# Patient Record
Sex: Female | Born: 1985
Health system: Southern US, Community
[De-identification: ages and names within clinical notes are randomized; demographics above are authoritative.]

## PROBLEM LIST (undated history)

## (undated) DIAGNOSIS — E282 Polycystic ovarian syndrome: Secondary | ICD-10-CM

## (undated) HISTORY — DX: Polycystic ovarian syndrome: E28.2

## (undated) HISTORY — PX: NO PAST SURGERIES: SHX2092

---

## 2011-01-26 ENCOUNTER — Emergency Department (HOSPITAL_BASED_OUTPATIENT_CLINIC_OR_DEPARTMENT_OTHER)
Admission: EM | Admit: 2011-01-26 | Discharge: 2011-01-26 | Disposition: A | Discharge status: Home / Self Care | Payer: Self-pay | Attending: Emergency Medicine | Admitting: Emergency Medicine

## 2011-01-26 ENCOUNTER — Other Ambulatory Visit: Payer: Self-pay

## 2011-01-26 ENCOUNTER — Encounter: Payer: Self-pay | Admitting: *Deleted

## 2011-01-26 ENCOUNTER — Emergency Department (INDEPENDENT_AMBULATORY_CARE_PROVIDER_SITE_OTHER): Payer: Self-pay

## 2011-01-26 DIAGNOSIS — N390 Urinary tract infection, site not specified: Secondary | ICD-10-CM

## 2011-01-26 DIAGNOSIS — R109 Unspecified abdominal pain: Secondary | ICD-10-CM

## 2011-01-26 DIAGNOSIS — A5901 Trichomonal vulvovaginitis: Secondary | ICD-10-CM

## 2011-01-26 DIAGNOSIS — R079 Chest pain, unspecified: Secondary | ICD-10-CM | POA: Insufficient documentation

## 2011-01-26 LAB — URINALYSIS, ROUTINE W REFLEX MICROSCOPIC
Hgb urine dipstick: NEGATIVE
Nitrite: NEGATIVE
Protein, ur: NEGATIVE mg/dL
Specific Gravity, Urine: 1.02 (ref 1.005–1.030)
Urobilinogen, UA: 0.2 mg/dL (ref 0.0–1.0)

## 2011-01-26 LAB — URINE MICROSCOPIC-ADD ON

## 2011-01-26 LAB — BASIC METABOLIC PANEL
Calcium: 9.7 mg/dL (ref 8.4–10.5)
GFR calc Af Amer: >60 mL/min (ref >60)
GFR calc non Af Amer: >60 mL/min (ref >60)
Glucose, Bld: 97 mg/dL (ref 70–99)
Potassium: 3.3 mEq/L — ABNORMAL LOW (ref 3.5–5.1)
Sodium: 139 mEq/L (ref 135–145)

## 2011-01-26 LAB — PREGNANCY, URINE: Preg Test, Ur: NEGATIVE

## 2011-01-26 MED ORDER — NITROFURANTOIN MONOHYD MACRO 100 MG PO CAPS: 100.0000 mg | ORAL_CAPSULE | Freq: Two times a day (BID) | ORAL | Status: AC

## 2011-01-26 MED ORDER — METRONIDAZOLE 500 MG PO TABS
2000.0000 mg | ORAL_TABLET | Freq: Once | ORAL | Status: AC
  Administered 2011-01-26: 2000 mg via ORAL
  Filled 2011-01-26: qty 4

## 2011-01-26 NOTE — ED Provider Notes (Signed)
History     Chief Complaint  Patient presents with  . Abdominal Pain  . Chest Pain   Patient is a 25 y.o. female presenting with abdominal pain and chest pain. The history is provided by the patient. No language interpreter was used.  Abdominal Pain The primary symptoms of the illness include abdominal pain. The primary symptoms of the illness do not include fever, shortness of breath, nausea, vomiting, dysuria, vaginal discharge or vaginal bleeding. The current episode started yesterday. The onset of the illness was gradual. The problem has not changed since onset. The patient states that she believes she is currently not pregnant. The patient has not had a change in bowel habit. Symptoms associated with the illness do not include chills.  Chest Pain Primary symptoms include abdominal pain. Pertinent negatives for primary symptoms include no fever, no shortness of breath, no nausea and no vomiting.     History reviewed. No pertinent past medical history.  History reviewed. No pertinent past surgical history.  History reviewed. No pertinent family history.  History  Substance Use Topics  . Smoking status: Never Smoker   . Smokeless tobacco: Not on file  . Alcohol Use: No    OB History    Grav Para Term Preterm Abortions TAB SAB Ect Mult Living                  Review of Systems  Constitutional: Negative for fever and chills.  Respiratory: Negative for shortness of breath.   Cardiovascular: Positive for chest pain.  Gastrointestinal: Positive for abdominal pain. Negative for nausea and vomiting.  Genitourinary: Negative for dysuria, vaginal bleeding and vaginal discharge.  All other systems reviewed and are negative.    Physical Exam  BP 115/82  Pulse 76  Temp(Src) 97.9 F (36.6 C) (Oral)  Resp 16  Wt 168 lb (76.204 kg)  LMP 01/12/2011  Physical Exam  Nursing note and vitals reviewed. Constitutional: She is oriented to person, place, and time. She appears  well-developed and well-nourished.  HENT:  Head: Normocephalic and atraumatic.  Eyes: Pupils are equal, round, and reactive to light.  Cardiovascular: Normal rate and regular rhythm.   Pulmonary/Chest: Effort normal and breath sounds normal.  Abdominal: Soft. There is no tenderness.  Musculoskeletal: Normal range of motion.  Neurological: She is alert and oriented to person, place, and time.  Skin: Skin is warm and dry.  Psychiatric: She has a normal mood and affect.    ED Course  Procedures  Results for orders placed during the hospital encounter of 01/26/11  PREGNANCY, URINE      Component Value Range   Preg Test, Ur NEGATIVE    URINALYSIS, ROUTINE W REFLEX MICROSCOPIC      Component Value Range   Color, Urine YELLOW  YELLOW    Appearance CLEAR  CLEAR    Specific Gravity, Urine 1.020  1.005 - 1.030    pH 7.0  5.0 - 8.0    Glucose, UA NEGATIVE  NEGATIVE (mg/dL)   Hgb urine dipstick NEGATIVE  NEGATIVE    Bilirubin Urine NEGATIVE  NEGATIVE    Ketones, ur NEGATIVE  NEGATIVE (mg/dL)   Protein, ur NEGATIVE  NEGATIVE (mg/dL)   Urobilinogen, UA 0.2  0.0 - 1.0 (mg/dL)   Nitrite NEGATIVE  NEGATIVE    Leukocytes, UA SMALL (*) NEGATIVE   BASIC METABOLIC PANEL      Component Value Range   Sodium 139  135 - 145 (mEq/L)   Potassium 3.3 (*) 3.5 -  5.1 (mEq/L)   Chloride 98  96 - 112 (mEq/L)   CO2 27  19 - 32 (mEq/L)   Glucose, Bld 97  70 - 99 (mg/dL)   BUN 8  6 - 23 (mg/dL)   Creatinine, Ser 1.61  0.50 - 1.10 (mg/dL)   Calcium 9.7  8.4 - 09.6 (mg/dL)   GFR calc non Af Amer >60  >60 (mL/min)   GFR calc Af Amer >60  >60 (mL/min)  URINE MICROSCOPIC-ADD ON      Component Value Range   Squamous Epithelial / LPF FEW (*) RARE    WBC, UA 7-10  <3 (WBC/hpf)   RBC / HPF 0-2  <3 (RBC/hpf)   Bacteria, UA FEW (*) RARE    Urine-Other TRICHOMONAS PRESENT     Dg Abd Acute W/chest  01/26/2011  *RADIOLOGY REPORT*  Clinical Data: Abdominal pain  ACUTE ABDOMEN SERIES (ABDOMEN 2 VIEW & CHEST 1  VIEW)  Comparison: None.  Findings: Frontal chest radiograph clear.  No free air on the erect film.  Normal bowel gas pattern.  Bilateral pelvic phleboliths. Regional bones unremarkable.  IMPRESSION: 1. Normal bowel gas pattern. 2.  No free air. 3.  No acute cardiopulmonary disease.  Original Report Authenticated By: Osa Craver, M.D.   Date: 01/26/2011  Rate: 75  Rhythm: normal sinus rhythm  QRS Axis: normal  Intervals: normal  ST/T Wave abnormalities: normal  Conduction Disutrbances:none  Narrative Interpretation:   Old EKG Reviewed: none available   MDM Discussed with pt that she needs to follow up at the std clinic for further evaluation:pt has a benign abdominal exam:will treat for simple uti:pt is afebrile       Teressa Lower, NP 01/26/11 2215

## 2011-01-26 NOTE — ED Notes (Signed)
Pt c/o chest pain , abd pain x 2 days. Denies n/v/d

## 2011-01-31 NOTE — ED Provider Notes (Signed)
Medical screening examination/treatment/procedure(s) were performed by non-physician practitioner and as supervising physician I was immediately available for consultation/collaboration.  Geoffery Lyons, MD 01/31/11 [phone]

## 2012-06-12 ENCOUNTER — Emergency Department (HOSPITAL_BASED_OUTPATIENT_CLINIC_OR_DEPARTMENT_OTHER)
Admission: EM | Admit: 2012-06-12 | Discharge: 2012-06-12 | Disposition: A | Discharge status: Home / Self Care | Payer: Self-pay | Attending: Emergency Medicine | Admitting: Emergency Medicine

## 2012-06-12 ENCOUNTER — Encounter (HOSPITAL_BASED_OUTPATIENT_CLINIC_OR_DEPARTMENT_OTHER): Payer: Self-pay | Admitting: *Deleted

## 2012-06-12 DIAGNOSIS — K5900 Constipation, unspecified: Secondary | ICD-10-CM | POA: Insufficient documentation

## 2012-06-12 DIAGNOSIS — N760 Acute vaginitis: Secondary | ICD-10-CM | POA: Insufficient documentation

## 2012-06-12 DIAGNOSIS — Z3202 Encounter for pregnancy test, result negative: Secondary | ICD-10-CM | POA: Insufficient documentation

## 2012-06-12 LAB — URINALYSIS, ROUTINE W REFLEX MICROSCOPIC
Glucose, UA: NEGATIVE mg/dL
Hgb urine dipstick: NEGATIVE
Ketones, ur: NEGATIVE mg/dL
pH: 5.5 (ref 5.0–8.0)

## 2012-06-12 LAB — WET PREP, GENITAL: Yeast Wet Prep HPF POC: NONE SEEN

## 2012-06-12 LAB — URINE MICROSCOPIC-ADD ON

## 2012-06-12 LAB — PREGNANCY, URINE: Preg Test, Ur: NEGATIVE

## 2012-06-12 MED ORDER — METRONIDAZOLE 500 MG PO TABS: 500.0000 mg | ORAL_TABLET | Freq: Two times a day (BID) | ORAL | Status: DC

## 2012-06-12 NOTE — ED Notes (Signed)
MD at bedside. 

## 2012-06-12 NOTE — ED Provider Notes (Signed)
History     CSN: [id number]  Arrival date & time 06/12/12  4098   First MD Initiated Contact with Patient 06/12/12 1801      Chief Complaint  Patient presents with  . Abdominal Pain    (Consider location/radiation/quality/duration/timing/severity/associated sxs/prior treatment) HPI Pt reports 3 months of abdominal bloating, breast soreness, constipation worsening in the last few weeks and associated with vaginal discharge for the last 3 weeks. She has history of trichomonas last year. Does not think she is pregnant. Pain is moderate cramping, mostly lower abdomen, comes and goes, No particular provoking or relieving factors.  History reviewed. No pertinent past medical history.  History reviewed. No pertinent past surgical history.  No family history on file.  History  Substance Use Topics  . Smoking status: Never Smoker   . Smokeless tobacco: Not on file  . Alcohol Use: No    OB History    Grav Para Term Preterm Abortions TAB SAB Ect Mult Living                  Review of Systems All other systems reviewed and are negative except as noted in HPI.   Allergies  Review of patient's allergies indicates no known allergies.  Home Medications   Current Outpatient Rx  Name  Route  Sig  Dispense  Refill  . ACAI BERRY PO   Oral   Take 2 tablets by mouth 2 (two) times daily.           Marland Kitchen ONE-DAILY MULTI VITAMINS PO TABS   Oral   Take 1 tablet by mouth daily.             BP 106/70  Pulse 88  Temp 98.3 F (36.8 C) (Oral)  Resp 18  SpO2 100%  LMP 05/18/2012  Physical Exam  Nursing note and vitals reviewed. Constitutional: She is oriented to person, place, and time. She appears well-developed and well-nourished.  HENT:  Head: Normocephalic and atraumatic.  Eyes: EOM are normal. Pupils are equal, round, and reactive to light.  Neck: Normal range of motion. Neck supple.  Cardiovascular: Normal rate, normal heart sounds and intact distal pulses.    Pulmonary/Chest: Effort normal and breath sounds normal. Right breast exhibits tenderness. Right breast exhibits no mass, no nipple discharge and no skin change. Left breast exhibits tenderness. Left breast exhibits no mass, no nipple discharge and no skin change. Breasts are symmetrical.  Abdominal: Bowel sounds are normal. She exhibits no distension. There is no tenderness. There is no rebound and no guarding.  Genitourinary: Uterus is not tender. Cervix exhibits discharge and friability. Cervix exhibits no motion tenderness. Right adnexum displays no mass and no tenderness. Left adnexum displays no tenderness. No bleeding around the vagina. Vaginal discharge found.  Musculoskeletal: Normal range of motion. She exhibits no edema and no tenderness.  Neurological: She is alert and oriented to person, place, and time. She has normal strength. No cranial nerve deficit or sensory deficit.  Skin: Skin is warm and dry. No rash noted.  Psychiatric: She has a normal mood and affect.    ED Course  Procedures (including critical care time)  Labs Reviewed  URINALYSIS, ROUTINE W REFLEX MICROSCOPIC - Abnormal; Notable for the following:    Leukocytes, UA SMALL (*)     All other components within normal limits  URINE MICROSCOPIC-ADD ON - Abnormal; Notable for the following:    Squamous Epithelial / LPF MANY (*)     Bacteria, UA MANY (*)  All other components within normal limits  PREGNANCY, URINE  URINE CULTURE  WET PREP, GENITAL  GC/CHLAMYDIA PROBE AMP   No results found.   No diagnosis found.    MDM  Wet prep positive for BV, will treat with Flagyl. I suspect that her abdominal bloating is more due to constipation than to a pelvic infection. Advised stool softener. PCP follow up as needed.         Quenton Recendez B. Bernette Mayers, MD 06/12/12 1910

## 2012-06-12 NOTE — ED Notes (Signed)
Abdominal tightness, breast soreness and vaginal discharge x 3 months. LMP November 15th.

## 2012-06-13 LAB — URINE CULTURE

## 2012-11-10 ENCOUNTER — Encounter (HOSPITAL_BASED_OUTPATIENT_CLINIC_OR_DEPARTMENT_OTHER): Payer: Self-pay | Admitting: *Deleted

## 2012-11-10 ENCOUNTER — Emergency Department (HOSPITAL_BASED_OUTPATIENT_CLINIC_OR_DEPARTMENT_OTHER)
Admission: EM | Admit: 2012-11-10 | Discharge: 2012-11-11 | Disposition: A | Discharge status: Home / Self Care | Payer: Self-pay | Attending: Emergency Medicine | Admitting: Emergency Medicine

## 2012-11-10 DIAGNOSIS — R109 Unspecified abdominal pain: Secondary | ICD-10-CM | POA: Insufficient documentation

## 2012-11-10 DIAGNOSIS — Z79899 Other long term (current) drug therapy: Secondary | ICD-10-CM | POA: Insufficient documentation

## 2012-11-10 DIAGNOSIS — K5900 Constipation, unspecified: Secondary | ICD-10-CM | POA: Insufficient documentation

## 2012-11-10 DIAGNOSIS — Z3202 Encounter for pregnancy test, result negative: Secondary | ICD-10-CM | POA: Insufficient documentation

## 2012-11-10 LAB — URINALYSIS, ROUTINE W REFLEX MICROSCOPIC
Bilirubin Urine: NEGATIVE
Glucose, UA: NEGATIVE mg/dL
Hgb urine dipstick: NEGATIVE
Ketones, ur: NEGATIVE mg/dL
Protein, ur: NEGATIVE mg/dL

## 2012-11-10 NOTE — ED Provider Notes (Signed)
History    This chart was scribed for Hanley Seamen, MD by Leone Payor, ED Scribe. This patient was seen in room MH07/MH07 and the patient's care was started 12:02 AM.   CSN: [id number]  Arrival date & time 11/10/12  2309   None     Chief Complaint  Patient presents with  . Abdominal Pain     The history is provided by the patient. No language interpreter was used.    HPI Comments: Julie Reyes is a 27 y.o. female who presents to the Emergency Department complaining of ongoing, episodic R upper abdominal pain, described as a tightness, starting 3 weeks ago. She also has LLQ and RLQ ("in my ovaries") pain that started 1 week ago. She has associated constipation for which she has taken a laxative and stool softeners for 2 days with moderate relief. Pain sometimes worsens after eating. She denies vaginal discharge or bleeding, nausea, vomiting, diarrhea, trouble urinating, dysuria, hematuria. Pt denies smoking and alcohol use.    History reviewed. No pertinent past medical history.  History reviewed. No pertinent past surgical history.  History reviewed. No pertinent family history.  History  Substance Use Topics  . Smoking status: Never Smoker   . Smokeless tobacco: Not on file  . Alcohol Use: No    OB History   Grav Para Term Preterm Abortions TAB SAB Ect Mult Living                  Review of Systems A complete 10 system review of systems was obtained and all systems are negative except as noted in the HPI and PMH.   Allergies  Review of patient's allergies indicates no known allergies.  Home Medications   Current Outpatient Rx  Name  Route  Sig  Dispense  Refill  . ACAI BERRY PO   Oral   Take 2 tablets by mouth 2 (two) times daily.           . metroNIDAZOLE (FLAGYL) 500 MG tablet   Oral   Take 1 tablet (500 mg total) by mouth 2 (two) times daily.   14 tablet   0   . Multiple Vitamin (MULTIVITAMIN) tablet   Oral   Take 1 tablet by mouth daily.              BP 118/75  Pulse 79  Temp(Src) 98.5 F (36.9 C) (Oral)  Resp 16  Ht 5\' 1"  (1.549 m)  Wt 155 lb (70.308 kg)  BMI 29.3 kg/m2  SpO2 100%  LMP 10/17/2012  Physical Exam  Nursing note and vitals reviewed. Constitutional: She appears well-developed and well-nourished.  HENT:  Head: Normocephalic and atraumatic.  Eyes: Conjunctivae are normal. Pupils are equal, round, and reactive to light.  Neck: Neck supple. No tracheal deviation present. No thyromegaly present.  Cardiovascular: Normal rate and regular rhythm.   No murmur heard. Pulmonary/Chest: Effort normal and breath sounds normal.  Abdominal: Soft. Bowel sounds are normal. She exhibits no distension.  RUQ tenderness. Minimal RLQ tenderness.   Musculoskeletal: Normal range of motion. She exhibits no edema and no tenderness.  Neurological: She is alert. Coordination normal.  Skin: Skin is warm and dry. No rash noted.  Psychiatric: She has a normal mood and affect.  Abdominal: no gallstones seen on bedside ultrasound  ED Course  Procedures (including critical care time)  DIAGNOSTIC STUDIES: Oxygen Saturation is 100% on room air, normal by my interpretation.    COORDINATION OF CARE: 12:14 AM Discussed treatment  plan with pt at bedside and pt agreed to plan.     MDM   Nursing notes and vitals signs, including pulse oximetry, reviewed.  Summary of this visit's results, reviewed by myself:  Labs:  Results for orders placed during the hospital encounter of 11/10/12 (from the past 24 hour(s))  URINALYSIS, ROUTINE W REFLEX MICROSCOPIC     Status: Abnormal   Collection Time    11/10/12 11:26 PM      Result Value Range   Color, Urine YELLOW  YELLOW   APPearance CLEAR  CLEAR   Specific Gravity, Urine 1.031 (*) 1.005 - 1.030   pH 7.0  5.0 - 8.0   Glucose, UA NEGATIVE  NEGATIVE mg/dL   Hgb urine dipstick NEGATIVE  NEGATIVE   Bilirubin Urine NEGATIVE  NEGATIVE   Ketones, ur NEGATIVE  NEGATIVE mg/dL   Protein, ur  NEGATIVE  NEGATIVE mg/dL   Urobilinogen, UA 1.0  0.0 - 1.0 mg/dL   Nitrite NEGATIVE  NEGATIVE   Leukocytes, UA NEGATIVE  NEGATIVE  PREGNANCY, URINE     Status: None   Collection Time    11/10/12 11:26 PM      Result Value Range   Preg Test, Ur NEGATIVE  NEGATIVE  CBC WITH DIFFERENTIAL     Status: Abnormal   Collection Time    11/11/12 12:44 AM      Result Value Range   WBC 8.3  4.0 - 10.5 K/uL   RBC 4.23  3.87 - 5.11 MIL/uL   Hemoglobin 12.0  12.0 - 15.0 g/dL   HCT 21.3 (*) 08.6 - 57.8 %   MCV 82.5  78.0 - 100.0 fL   MCH 28.4  26.0 - 34.0 pg   MCHC 34.4  30.0 - 36.0 g/dL   RDW 46.9  62.9 - 52.8 %   Platelets 241  150 - 400 K/uL   Neutrophils Relative 46  43 - 77 %   Neutro Abs 3.8  1.7 - 7.7 K/uL   Lymphocytes Relative 43  12 - 46 %   Lymphs Abs 3.5  0.7 - 4.0 K/uL   Monocytes Relative 9  3 - 12 %   Monocytes Absolute 0.8  0.1 - 1.0 K/uL   Eosinophils Relative 2  0 - 5 %   Eosinophils Absolute 0.1  0.0 - 0.7 K/uL   Basophils Relative 0  0 - 1 %   Basophils Absolute 0.0  0.0 - 0.1 K/uL  LIPASE, BLOOD     Status: None   Collection Time    11/11/12 12:44 AM      Result Value Range   Lipase 27  11 - 59 U/L  COMPREHENSIVE METABOLIC PANEL     Status: Abnormal   Collection Time    11/11/12 12:44 AM      Result Value Range   Sodium 139  135 - 145 mEq/L   Potassium 3.7  3.5 - 5.1 mEq/L   Chloride 104  96 - 112 mEq/L   CO2 24  19 - 32 mEq/L   Glucose, Bld 99  70 - 99 mg/dL   BUN 14  6 - 23 mg/dL   Creatinine, Ser 4.13  0.50 - 1.10 mg/dL   Calcium 9.1  8.4 - 24.4 mg/dL   Total Protein 6.6  6.0 - 8.3 g/dL   Albumin 3.5  3.5 - 5.2 g/dL   AST 12  0 - 37 U/L   ALT 6  0 - 35 U/L   Alkaline Phosphatase  55  39 - 117 U/L   Total Bilirubin 0.2 (*) 0.3 - 1.2 mg/dL   GFR calc non Af Amer >90  >90 mL/min   GFR calc Af Amer >90  >90 mL/min   1:26 AM Patient's pain and tenderness are minimal in the ED. We will obtain an outpatient ultrasound of the gallbladder. She was advised to  take an over-the-counter PPI such as Prilosec or Prevacid.   The patient states the lower "ovarian" pain has been coming and going for an extended period of time. Her acute complaint is the right-sided upper abdominal pain.    I personally performed the services described in this documentation, which was scribed in my presence.  The recorded information has been reviewed and is accurate.  Hanley Seamen, MD 11/11/12 [phone]

## 2012-11-10 NOTE — ED Notes (Signed)
Pt states she has had a "tightness" in her abd on the right side "in the middle" x 2-3 weeks. "My ovaries hurt. It started on the right and then both side began to hurt." Also c/o constipation. Last BM today, but "had to push". Denies vaginal d/c or urinary s/s.

## 2012-11-11 ENCOUNTER — Ambulatory Visit (HOSPITAL_BASED_OUTPATIENT_CLINIC_OR_DEPARTMENT_OTHER)
Admit: 2012-11-11 | Discharge: 2012-11-11 | Disposition: A | Discharge status: Home / Self Care | Payer: Self-pay | Attending: Emergency Medicine | Admitting: Emergency Medicine

## 2012-11-11 DIAGNOSIS — R1011 Right upper quadrant pain: Secondary | ICD-10-CM | POA: Insufficient documentation

## 2012-11-11 LAB — CBC WITH DIFFERENTIAL/PLATELET
Basophils Relative: 0 % (ref 0–1)
Eosinophils Absolute: 0.1 10*3/uL (ref 0.0–0.7)
HCT: 34.9 % — ABNORMAL LOW (ref 36.0–46.0)
Hemoglobin: 12 g/dL (ref 12.0–15.0)
MCH: 28.4 pg (ref 26.0–34.0)
MCHC: 34.4 g/dL (ref 30.0–36.0)
MCV: 82.5 fL (ref 78.0–100.0)
Monocytes Absolute: 0.8 10*3/uL (ref 0.1–1.0)
Monocytes Relative: 9 % (ref 3–12)
Neutro Abs: 3.8 10*3/uL (ref 1.7–7.7)

## 2012-11-11 LAB — COMPREHENSIVE METABOLIC PANEL
Alkaline Phosphatase: 55 U/L (ref 39–117)
BUN: 14 mg/dL (ref 6–23)
GFR calc Af Amer: >90 mL/min (ref >90)
Glucose, Bld: 99 mg/dL (ref 70–99)
Potassium: 3.7 mEq/L (ref 3.5–5.1)
Total Protein: 6.6 g/dL (ref 6.0–8.3)

## 2012-11-11 MED ORDER — PANTOPRAZOLE SODIUM 40 MG IV SOLR: 40.0000 mg | Freq: Once | INTRAVENOUS | Status: DC

## 2012-11-11 MED ORDER — PANTOPRAZOLE SODIUM 40 MG PO TBEC
40.0000 mg | DELAYED_RELEASE_TABLET | Freq: Once | ORAL | Status: AC
  Administered 2012-11-11: 40 mg via ORAL

## 2012-11-11 MED ORDER — PANTOPRAZOLE SODIUM 40 MG PO TBEC
DELAYED_RELEASE_TABLET | ORAL | Status: AC
  Administered 2012-11-11: 40 mg via ORAL
  Filled 2012-11-11: qty 1

## 2012-11-11 NOTE — ED Notes (Signed)
MD at bedside with ultrasound to assess

## 2012-11-11 NOTE — ED Notes (Signed)
Pt verbalizes understanding to f/u with ultrasound

## 2013-08-05 ENCOUNTER — Other Ambulatory Visit (HOSPITAL_COMMUNITY): Payer: Self-pay | Admitting: Obstetrics & Gynecology

## 2013-08-05 DIAGNOSIS — IMO0002 Reserved for concepts with insufficient information to code with codable children: Secondary | ICD-10-CM

## 2013-08-09 ENCOUNTER — Ambulatory Visit (HOSPITAL_COMMUNITY)
Admission: RE | Admit: 2013-08-09 | Discharge: 2013-08-09 | Disposition: A | Discharge status: Home / Self Care | Payer: 59 | Source: Ambulatory Visit | Attending: Obstetrics & Gynecology | Admitting: Obstetrics & Gynecology

## 2013-08-09 ENCOUNTER — Ambulatory Visit (HOSPITAL_COMMUNITY): Admission: RE | Admit: 2013-08-09 | Payer: 59 | Source: Ambulatory Visit

## 2013-08-09 DIAGNOSIS — N979 Female infertility, unspecified: Secondary | ICD-10-CM | POA: Insufficient documentation

## 2013-08-09 DIAGNOSIS — IMO0002 Reserved for concepts with insufficient information to code with codable children: Secondary | ICD-10-CM

## 2013-08-09 MED ORDER — IOHEXOL 300 MG/ML  SOLN
20.0000 mL | Freq: Once | INTRAMUSCULAR | Status: AC | PRN
  Administered 2013-08-09: 6 mL

## 2013-08-15 ENCOUNTER — Ambulatory Visit (HOSPITAL_COMMUNITY): Payer: 59

## 2013-09-19 ENCOUNTER — Ambulatory Visit (INDEPENDENT_AMBULATORY_CARE_PROVIDER_SITE_OTHER): Payer: 59 | Admitting: *Deleted

## 2013-09-19 DIAGNOSIS — N979 Female infertility, unspecified: Secondary | ICD-10-CM

## 2013-09-20 LAB — PROGESTERONE: Progesterone: 0.6 ng/mL

## 2013-10-02 ENCOUNTER — Other Ambulatory Visit: Payer: Self-pay | Admitting: Cardiology

## 2013-10-02 ENCOUNTER — Ambulatory Visit: Payer: 59

## 2013-10-02 DIAGNOSIS — N912 Amenorrhea, unspecified: Secondary | ICD-10-CM

## 2013-10-03 LAB — HCG, SERUM, QUALITATIVE: Preg, Serum: NEGATIVE

## 2013-12-30 ENCOUNTER — Encounter (INDEPENDENT_AMBULATORY_CARE_PROVIDER_SITE_OTHER): Payer: Self-pay

## 2013-12-30 ENCOUNTER — Ambulatory Visit (INDEPENDENT_AMBULATORY_CARE_PROVIDER_SITE_OTHER): Payer: 59 | Admitting: Pulmonary Disease

## 2013-12-30 ENCOUNTER — Encounter: Payer: Self-pay | Admitting: Pulmonary Disease

## 2013-12-30 VITALS — BP 118/80 | HR 80 | Temp 98.5°F | Ht 60.0 in | Wt 171.4 lb

## 2013-12-30 DIAGNOSIS — G4733 Obstructive sleep apnea (adult) (pediatric): Secondary | ICD-10-CM | POA: Insufficient documentation

## 2013-12-30 NOTE — Progress Notes (Signed)
Subjective:    Patient ID: Julie CreaseShanice Modesto, female    DOB: 12/16/1985, 28 y.o.   MRN: [id number]  HPI The patient is a 28 year old female who I've been asked to see for possible obstructive sleep apnea. She has been noted to have loud snoring, as well as an abnormal breathing pattern during sleep by her husband. She awakens approximately twice a night, but this is much improved since she has lost a little bit of weight. She typically will rise 5 AM to take her husband to work, and then will come back and take a one-hour nap before she goes to work. She is not rested most mornings. She notes definite sleep pressure during the day with inactivity, and can doze at home in the evenings while watching television or movies. She also notes some sleepiness driving longer distances. The patient has gained 20 pounds over the last 2 years, and her Epworth score today is 8.   Sleep Questionnaire What time do you typically go to bed?( Between what hours) 9-11pm 9-11pm at 1131 on 12/30/13 by Darrell JewelJennifer R Castillo, CMA How long does it take you to fall asleep? 10-15 minutes 10-15 minutes at 1131 on 12/30/13 by Darrell JewelJennifer R Castillo, CMA How many times during the night do you wake up? 2 2 at 1131 on 12/30/13 by Darrell JewelJennifer R Castillo, CMA What time do you get out of bed to start your day? 0500 0500 at 1131 on 12/30/13 by Darrell JewelJennifer R Castillo, CMA Do you drive or operate heavy machinery in your occupation? No No at 1131 on 12/30/13 by Darrell JewelJennifer R Castillo, CMA How much has your weight changed (up or down) over the past two years? (In pounds) 20 lb (9.072 kg) 20 lb (9.072 kg) at 1131 on 12/30/13 by Darrell JewelJennifer R Castillo, CMA Have you ever had a sleep study before? No No at 1131 on 12/30/13 by Darrell JewelJennifer R Castillo, CMA Do you currently use CPAP? No No at 1131 on 12/30/13 by Darrell JewelJennifer R Castillo, CMA Do you wear oxygen at any time? No No at 1131 on 12/30/13 by Darrell JewelJennifer R Castillo, CMA   Review of Systems    Constitutional: Negative for fever and unexpected weight change.  HENT: Negative for congestion, dental problem, ear pain, nosebleeds, postnasal drip, rhinorrhea, sinus pressure, sneezing, sore throat and trouble swallowing.   Eyes: Negative for redness and itching.  Respiratory: Positive for shortness of breath. Negative for cough, chest tightness and wheezing.   Cardiovascular: Positive for chest pain. Negative for palpitations and leg swelling.  Gastrointestinal: Negative for nausea and vomiting.  Genitourinary: Negative for dysuria.  Musculoskeletal: Negative for joint swelling.  Skin: Negative for rash.  Neurological: Negative for headaches.  Hematological: Does not bruise/bleed easily.  Psychiatric/Behavioral: Negative for dysphoric mood. The patient is not nervous/anxious.        Objective:   Physical Exam  Constitutional:  Well developed, no acute distress  HENT:  Nares patent without discharge, but large turbinates  Oropharynx without exudate, palate and uvula are mildly elongated, very large tonsils.  Eyes:  Perrla, eomi, no scleral icterus  Neck:  No JVD, no TMG  Cardiovascular:  Normal rate, regular rhythm, no rubs or gallops.  No murmurs        Intact distal pulses  Pulmonary :  Normal breath sounds, no stridor or respiratory distress   No rales, rhonchi, or wheezing  Abdominal:  Soft, nondistended, bowel sounds present.  No tenderness noted.   Musculoskeletal:  No lower extremity edema noted.  Lymph Nodes:  No cervical lymphadenopathy noted  Skin:  No cyanosis noted  Neurologic:  Alert, appropriate, moves all 4 extremities without obvious deficit.        Assessment & Plan:

## 2013-12-30 NOTE — Assessment & Plan Note (Signed)
The patient's history is very suggestive of some type of sleep disordered breathing. It is unclear whether she has full-blown sleep apnea, or the upper airway resistant syndrome. She does have weight to lose, but also has very large tonsils that are clearly decreasing her airway size. It had a long discussion with her about sleep apnea, including its impact to her quality of life and cardiovascular health. She will need a sleep study for diagnosis, and I think she is an excellent candidate for home sleep testing. The patient is agreeable to this approach.

## 2013-12-30 NOTE — Patient Instructions (Signed)
Will schedule for home sleep testing, and will arrange followup once the results are available.   

## 2014-01-06 ENCOUNTER — Telehealth: Payer: Self-pay | Admitting: Pulmonary Disease

## 2014-01-06 NOTE — Telephone Encounter (Signed)
Please advise PCC;s thanks 

## 2014-01-07 NOTE — Telephone Encounter (Signed)
Left pt a detailed message that HST has been approved and she will be hearing from pcc's within the next few week for the HST machine to take hime Julie Reyes

## 2014-01-28 ENCOUNTER — Telehealth: Payer: Self-pay | Admitting: Pulmonary Disease

## 2014-01-28 NOTE — Telephone Encounter (Signed)
Spoke to pt she is aware we will be calling her in about a week to set up HST Tobe SosSally E Reyes

## 2014-02-06 DIAGNOSIS — G4730 Sleep apnea, unspecified: Secondary | ICD-10-CM

## 2014-02-06 DIAGNOSIS — G4710 Hypersomnia, unspecified: Secondary | ICD-10-CM

## 2014-02-11 ENCOUNTER — Telehealth: Payer: Self-pay | Admitting: Pulmonary Disease

## 2014-02-11 DIAGNOSIS — G4710 Hypersomnia, unspecified: Secondary | ICD-10-CM

## 2014-02-11 DIAGNOSIS — G4730 Sleep apnea, unspecified: Secondary | ICD-10-CM

## 2014-02-11 NOTE — Telephone Encounter (Signed)
Appt set to review sleep study on 03-14-14. Carron CurieJennifer Creighton Longley, CMA

## 2014-02-11 NOTE — Telephone Encounter (Signed)
Pt needs ov to review sleep study 

## 2014-02-12 ENCOUNTER — Encounter: Payer: Self-pay | Admitting: Pulmonary Disease

## 2014-03-14 ENCOUNTER — Ambulatory Visit (INDEPENDENT_AMBULATORY_CARE_PROVIDER_SITE_OTHER): Payer: 59 | Admitting: Pulmonary Disease

## 2014-03-14 ENCOUNTER — Encounter: Payer: Self-pay | Admitting: Pulmonary Disease

## 2014-03-14 VITALS — BP 118/66 | HR 76 | Temp 98.4°F | Ht 60.0 in | Wt 172.2 lb

## 2014-03-14 DIAGNOSIS — G4733 Obstructive sleep apnea (adult) (pediatric): Secondary | ICD-10-CM

## 2014-03-14 NOTE — Progress Notes (Signed)
   Subjective:    Patient ID: Julie Reyes, female    DOB: 02/11/1986, 28 y.o.   MRN: [id number]  HPI Patient comes in today for followup of her recent sleep study. She was found to have minimal obstructive sleep apnea, with an AHI of 5 events per hour. I have reviewed the study with her in detail, and answered all of her questions.   Review of Systems  Constitutional: Negative for fever and unexpected weight change.  HENT: Negative for congestion, dental problem, ear pain, nosebleeds, postnasal drip, rhinorrhea, sinus pressure, sneezing, sore throat and trouble swallowing.   Eyes: Negative for redness and itching.  Respiratory: Negative for cough, chest tightness, shortness of breath and wheezing.   Cardiovascular: Negative for palpitations and leg swelling.  Gastrointestinal: Negative for nausea and vomiting.  Genitourinary: Negative for dysuria.  Musculoskeletal: Negative for joint swelling.  Skin: Negative for rash.  Neurological: Negative for headaches.  Hematological: Does not bruise/bleed easily.  Psychiatric/Behavioral: Negative for dysphoric mood. The patient is not nervous/anxious.        Objective:   Physical Exam Overweight female in no acute distress Nose without purulence or discharge noted Neck without lymphadenopathy or thyromegaly Lower extremities without edema, no cyanosis Alert and oriented, moves all 4 extremities.       Assessment & Plan:

## 2014-03-14 NOTE — Assessment & Plan Note (Signed)
The patient has minimal obstructive sleep apnea by her recent home sleep test, and I have reviewed the results with her. I have outlined a conservative approach with a trial of weight loss and positional therapy, versus a more aggressive approach with CPAP or a dental appliance. After a long conversation, the patient would like to try weight loss alone for a period of time. If she is unsuccessful, or if her symptoms worsen, she will call us to consider more aggressive treatment.

## 2014-03-14 NOTE — Patient Instructions (Signed)
Work on weight loss, and try to stay off your back while sleeping. Please call if you are not successful with weight loss, or if you would like to treat your sleep apnea more aggressively as we discussed.

## 2014-04-04 ENCOUNTER — Emergency Department (HOSPITAL_BASED_OUTPATIENT_CLINIC_OR_DEPARTMENT_OTHER)
Admission: EM | Admit: 2014-04-04 | Discharge: 2014-04-04 | Disposition: A | Discharge status: Home / Self Care | Payer: 59 | Attending: Emergency Medicine | Admitting: Emergency Medicine

## 2014-04-04 ENCOUNTER — Encounter (HOSPITAL_BASED_OUTPATIENT_CLINIC_OR_DEPARTMENT_OTHER): Payer: Self-pay | Admitting: Emergency Medicine

## 2014-04-04 ENCOUNTER — Emergency Department (HOSPITAL_BASED_OUTPATIENT_CLINIC_OR_DEPARTMENT_OTHER): Payer: 59

## 2014-04-04 DIAGNOSIS — S199XXA Unspecified injury of neck, initial encounter: Secondary | ICD-10-CM | POA: Diagnosis present

## 2014-04-04 DIAGNOSIS — S134XXA Sprain of ligaments of cervical spine, initial encounter: Secondary | ICD-10-CM | POA: Diagnosis not present

## 2014-04-04 DIAGNOSIS — E282 Polycystic ovarian syndrome: Secondary | ICD-10-CM | POA: Diagnosis not present

## 2014-04-04 DIAGNOSIS — Y92410 Unspecified street and highway as the place of occurrence of the external cause: Secondary | ICD-10-CM | POA: Diagnosis not present

## 2014-04-04 DIAGNOSIS — Y9389 Activity, other specified: Secondary | ICD-10-CM | POA: Diagnosis not present

## 2014-04-04 DIAGNOSIS — Z79899 Other long term (current) drug therapy: Secondary | ICD-10-CM | POA: Insufficient documentation

## 2014-04-04 DIAGNOSIS — S161XXA Strain of muscle, fascia and tendon at neck level, initial encounter: Secondary | ICD-10-CM

## 2014-04-04 MED ORDER — HYDROCODONE-ACETAMINOPHEN 5-325 MG PO TABS: 1.0000 | ORAL_TABLET | ORAL | Status: DC | PRN

## 2014-04-04 MED ORDER — CYCLOBENZAPRINE HCL 10 MG PO TABS: 10.0000 mg | ORAL_TABLET | Freq: Two times a day (BID) | ORAL | Status: DC | PRN

## 2014-04-04 MED ORDER — IBUPROFEN 800 MG PO TABS: 800.0000 mg | ORAL_TABLET | Freq: Three times a day (TID) | ORAL | Status: DC

## 2014-04-04 MED ORDER — HYDROCODONE-ACETAMINOPHEN 5-325 MG PO TABS
1.0000 | ORAL_TABLET | Freq: Once | ORAL | Status: AC
  Administered 2014-04-04: 1 via ORAL
  Filled 2014-04-04: qty 1

## 2014-04-04 NOTE — ED Provider Notes (Signed)
Medical screening examination/treatment/procedure(s) were performed by non-physician practitioner and as supervising physician I was immediately available for consultation/collaboration.   EKG Interpretation None        Jenniffer Vessels, MD 04/04/14 2333 
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
## 2014-04-04 NOTE — Discharge Instructions (Signed)
Cervical Sprain A cervical sprain is when the tissues (ligaments) that hold the neck bones in place stretch or tear. HOME CARE   Put ice on the injured area.  Put ice in a plastic bag.  Place a towel between your skin and the bag.  Leave the ice on for 15-20 minutes, 3-4 times a day.  You may have been given a collar to wear. This collar keeps your neck from moving while you heal.  Do not take the collar off unless told by your doctor.  If you have long hair, keep it outside of the collar.  Ask your doctor before changing the position of your collar. You may need to change its position over time to make it more comfortable.  If you are allowed to take off the collar for cleaning or bathing, follow your doctor's instructions on how to do it safely.  Keep your collar clean by wiping it with mild soap and water. Dry it completely. If the collar has removable pads, remove them every 1-2 days to hand wash them with soap and water. Allow them to air dry. They should be dry before you wear them in the collar.  Do not drive while wearing the collar.  Only take medicine as told by your doctor.  Keep all doctor visits as told.  Keep all physical therapy visits as told.  Adjust your work station so that you have good posture while you work.  Avoid positions and activities that make your problems worse.  Warm up and stretch before being active. GET HELP IF:  Your pain is not controlled with medicine.  You cannot take less pain medicine over time as planned.  Your activity level does not improve as expected. GET HELP RIGHT AWAY IF:   You are bleeding.  Your stomach is upset.  You have an allergic reaction to your medicine.  You develop new problems that you cannot explain.  You lose feeling (become numb) or you cannot move any part of your body (paralysis).  You have tingling or weakness in any part of your body.  Your symptoms get worse. Symptoms include:  Pain,  soreness, stiffness, puffiness (swelling), or a burning feeling in your neck.  Pain when your neck is touched.  Shoulder or upper back pain.  Limited ability to move your neck.  Headache.  Dizziness.  Your hands or arms feel week, lose feeling, or tingle.  Muscle spasms.  Difficulty swallowing or chewing. MAKE SURE YOU:   Understand these instructions.  Will watch your condition.  Will get help right away if you are not doing well or get worse. Document Released: 12/07/2007 Document Revised: 02/20/2013 Document Reviewed: 12/26/2012 [facility] Patient Information 2015 [place], [place]. This information is not intended to replace advice given to you by your health care provider. Make sure you discuss any questions you have with your health care provider.  Cryotherapy Cryotherapy means treatment with cold. Ice or gel packs can be used to reduce both pain and swelling. Ice is the most helpful within the first 24 to 48 hours after an injury or flare-up from overusing a muscle or joint. Sprains, strains, spasms, burning pain, shooting pain, and aches can all be eased with ice. Ice can also be used when recovering from surgery. Ice is effective, has very few side effects, and is safe for most people to use. PRECAUTIONS  Ice is not a safe treatment option for people with:  Raynaud phenomenon. This is a condition affecting small blood  vessels in the extremities. Exposure to cold may cause your problems to return.  Cold hypersensitivity. There are many forms of cold hypersensitivity, including:  Cold urticaria. Red, itchy hives appear on the skin when the tissues begin to warm after being iced.  Cold erythema. This is a red, itchy rash caused by exposure to cold.  Cold hemoglobinuria. Red blood cells break down when the tissues begin to warm after being iced. The hemoglobin that carry oxygen are passed into the urine because they cannot combine with blood proteins fast enough.  Numbness  or altered sensitivity in the area being iced. If you have any of the following conditions, do not use ice until you have discussed cryotherapy with your caregiver:  Heart conditions, such as arrhythmia, angina, or chronic heart disease.  High blood pressure.  Healing wounds or open skin in the area being iced.  Current infections.  Rheumatoid arthritis.  Poor circulation.  Diabetes. Ice slows the blood flow in the region it is applied. This is beneficial when trying to stop inflamed tissues from spreading irritating chemicals to surrounding tissues. However, if you expose your skin to cold temperatures for too long or without the proper protection, you can damage your skin or nerves. Watch for signs of skin damage due to cold. HOME CARE INSTRUCTIONS Follow these tips to use ice and cold packs safely.  Place a dry or damp towel between the ice and skin. A damp towel will cool the skin more quickly, so you may need to shorten the time that the ice is used.  For a more rapid response, add gentle compression to the ice.  Ice for no more than 10 to 20 minutes at a time. The bonier the area you are icing, the less time it will take to get the benefits of ice.  Check your skin after 5 minutes to make sure there are no signs of a poor response to cold or skin damage.  Rest 20 minutes or more between uses.  Once your skin is numb, you can end your treatment. You can test numbness by very lightly touching your skin. The touch should be so light that you do not see the skin dimple from the pressure of your fingertip. When using ice, most people will feel these normal sensations in this order: cold, burning, aching, and numbness.  Do not use ice on someone who cannot communicate their responses to pain, such as small children or people with dementia. HOW TO MAKE AN ICE PACK Ice packs are the most common way to use ice therapy. Other methods include ice massage, ice baths, and cryosprays.  Muscle creams that cause a cold, tingly feeling do not offer the same benefits that ice offers and should not be used as a substitute unless recommended by your caregiver. To make an ice pack, do one of the following:  Place crushed ice or a bag of frozen vegetables in a sealable plastic bag. Squeeze out the excess air. Place this bag inside another plastic bag. Slide the bag into a pillowcase or place a damp towel between your skin and the bag.  Mix 3 parts water with 1 part rubbing alcohol. Freeze the mixture in a sealable plastic bag. When you remove the mixture from the freezer, it will be slushy. Squeeze out the excess air. Place this bag inside another plastic bag. Slide the bag into a pillowcase or place a damp towel between your skin and the bag. SEEK MEDICAL CARE IF:  You  develop white spots on your skin. This may give the skin a blotchy (mottled) appearance.  Your skin turns blue or pale.  Your skin becomes waxy or hard.  Your swelling gets worse. MAKE SURE YOU:   Understand these instructions.  Will watch your condition.  Will get help right away if you are not doing well or get worse. Document Released: 02/14/2011 Document Revised: 11/04/2013 Document Reviewed: 02/14/2011 [facility] Patient Information 2015 [place], [place]. This information is not intended to replace advice given to you by your health care provider. Make sure you discuss any questions you have with your health care provider. Motor Vehicle Collision It is common to have multiple bruises and sore muscles after a motor vehicle collision (MVC). These tend to feel worse for the first 24 hours. You may have the most stiffness and soreness over the first several hours. You may also feel worse when you wake up the first morning after your collision. After this point, you will usually begin to improve with each day. The speed of improvement often depends on the severity of the collision, the number of injuries, and the  location and nature of these injuries. HOME CARE INSTRUCTIONS  Put ice on the injured area.  Put ice in a plastic bag.  Place a towel between your skin and the bag.  Leave the ice on for 15-20 minutes, 3-4 times a day, or as directed by your health care provider.  Drink enough fluids to keep your urine clear or pale yellow. Do not drink alcohol.  Take a warm shower or bath once or twice a day. This will increase blood flow to sore muscles.  You may return to activities as directed by your caregiver. Be careful when lifting, as this may aggravate neck or back pain.  Only take over-the-counter or prescription medicines for pain, discomfort, or fever as directed by your caregiver. Do not use aspirin. This may increase bruising and bleeding. SEEK IMMEDIATE MEDICAL CARE IF:  You have numbness, tingling, or weakness in the arms or legs.  You develop severe headaches not relieved with medicine.  You have severe neck pain, especially tenderness in the middle of the back of your neck.  You have changes in bowel or bladder control.  There is increasing pain in any area of the body.  You have shortness of breath, light-headedness, dizziness, or fainting.  You have chest pain.  You feel sick to your stomach (nauseous), throw up (vomit), or sweat.  You have increasing abdominal discomfort.  There is blood in your urine, stool, or vomit.  You have pain in your shoulder (shoulder strap areas).  You feel your symptoms are getting worse. MAKE SURE YOU:  Understand these instructions.  Will watch your condition.  Will get help right away if you are not doing well or get worse. Document Released: 06/20/2005 Document Revised: 11/04/2013 Document Reviewed: 11/17/2010 [facility] Patient Information 2015 [place], [place]. This information is not intended to replace advice given to you by your health care provider. Make sure you discuss any questions you have with your health care  provider.
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
## 2014-04-14 ENCOUNTER — Ambulatory Visit: Payer: 59 | Admitting: Family Medicine

## 2014-04-15 ENCOUNTER — Telehealth: Payer: Self-pay | Admitting: *Deleted

## 2014-04-15 ENCOUNTER — Ambulatory Visit: Payer: 59 | Admitting: Family

## 2014-04-15 DIAGNOSIS — Z0289 Encounter for other administrative examinations: Secondary | ICD-10-CM

## 2014-04-15 NOTE — Telephone Encounter (Signed)
Please do not schedule new patient appointment with me.

## 2014-04-15 NOTE — Telephone Encounter (Signed)
Pt did not show for appointment 04/15/2014 at 1:45pm to Establish Care

## 2014-04-16 NOTE — Telephone Encounter (Signed)
Put note in pt demographics stating per Sandford CrazeMelissa O'sullivan, do not schedule new patient appointment with her.

## 2015-03-16 ENCOUNTER — Emergency Department (HOSPITAL_BASED_OUTPATIENT_CLINIC_OR_DEPARTMENT_OTHER)
Admission: EM | Admit: 2015-03-16 | Discharge: 2015-03-16 | Disposition: A | Discharge status: Home / Self Care | Payer: Commercial Managed Care - PPO | Attending: Emergency Medicine | Admitting: Emergency Medicine

## 2015-03-16 ENCOUNTER — Encounter (HOSPITAL_BASED_OUTPATIENT_CLINIC_OR_DEPARTMENT_OTHER): Payer: Self-pay | Admitting: *Deleted

## 2015-03-16 DIAGNOSIS — Z3202 Encounter for pregnancy test, result negative: Secondary | ICD-10-CM | POA: Insufficient documentation

## 2015-03-16 DIAGNOSIS — Z791 Long term (current) use of non-steroidal anti-inflammatories (NSAID): Secondary | ICD-10-CM | POA: Diagnosis not present

## 2015-03-16 DIAGNOSIS — Z8639 Personal history of other endocrine, nutritional and metabolic disease: Secondary | ICD-10-CM | POA: Insufficient documentation

## 2015-03-16 DIAGNOSIS — Z79899 Other long term (current) drug therapy: Secondary | ICD-10-CM | POA: Diagnosis not present

## 2015-03-16 DIAGNOSIS — R109 Unspecified abdominal pain: Secondary | ICD-10-CM | POA: Diagnosis present

## 2015-03-16 LAB — URINALYSIS, ROUTINE W REFLEX MICROSCOPIC
BILIRUBIN URINE: NEGATIVE
GLUCOSE, UA: NEGATIVE mg/dL
HGB URINE DIPSTICK: NEGATIVE
KETONES UR: NEGATIVE mg/dL
LEUKOCYTES UA: NEGATIVE
Nitrite: NEGATIVE
PH: 8 (ref 5.0–8.0)
PROTEIN: NEGATIVE mg/dL
Specific Gravity, Urine: 1.024 (ref 1.005–1.030)
Urobilinogen, UA: 1 mg/dL (ref 0.0–1.0)

## 2015-03-16 LAB — CBC WITH DIFFERENTIAL/PLATELET
BASOS PCT: 1 % (ref 0–1)
Basophils Absolute: 0 10*3/uL (ref 0.0–0.1)
EOS ABS: 0.2 10*3/uL (ref 0.0–0.7)
Eosinophils Relative: 3 % (ref 0–5)
HCT: 35.9 % — ABNORMAL LOW (ref 36.0–46.0)
HEMOGLOBIN: 11.5 g/dL — AB (ref 12.0–15.0)
LYMPHS ABS: 3.1 10*3/uL (ref 0.7–4.0)
Lymphocytes Relative: 48 % — ABNORMAL HIGH (ref 12–46)
MCH: 26.3 pg (ref 26.0–34.0)
MCHC: 32 g/dL (ref 30.0–36.0)
MCV: 82.2 fL (ref 78.0–100.0)
MONO ABS: 0.5 10*3/uL (ref 0.1–1.0)
MONOS PCT: 8 % (ref 3–12)
NEUTROS PCT: 40 % — AB (ref 43–77)
Neutro Abs: 2.5 10*3/uL (ref 1.7–7.7)
Platelets: 261 10*3/uL (ref 150–400)
RBC: 4.37 MIL/uL (ref 3.87–5.11)
RDW: 14.4 % (ref 11.5–15.5)
WBC: 6.4 10*3/uL (ref 4.0–10.5)

## 2015-03-16 LAB — COMPREHENSIVE METABOLIC PANEL
ALK PHOS: 48 U/L (ref 38–126)
ALT: 11 U/L — ABNORMAL LOW (ref 14–54)
ANION GAP: 6 (ref 5–15)
AST: 13 U/L — ABNORMAL LOW (ref 15–41)
Albumin: 3.6 g/dL (ref 3.5–5.0)
BILIRUBIN TOTAL: 0.2 mg/dL — AB (ref 0.3–1.2)
BUN: 8 mg/dL (ref 6–20)
CALCIUM: 8.6 mg/dL — AB (ref 8.9–10.3)
CO2: 26 mmol/L (ref 22–32)
Chloride: 107 mmol/L (ref 101–111)
Creatinine, Ser: 0.8 mg/dL (ref 0.44–1.00)
GFR calc non Af Amer: >60 mL/min (ref >60)
Glucose, Bld: 102 mg/dL — ABNORMAL HIGH (ref 65–99)
POTASSIUM: 3.9 mmol/L (ref 3.5–5.1)
SODIUM: 139 mmol/L (ref 135–145)
TOTAL PROTEIN: 6.4 g/dL — AB (ref 6.5–8.1)

## 2015-03-16 LAB — LIPASE, BLOOD: Lipase: 32 U/L (ref 22–51)

## 2015-03-16 LAB — PREGNANCY, URINE: Preg Test, Ur: NEGATIVE

## 2015-03-16 MED ORDER — ONDANSETRON 4 MG PO TBDP: 4.0000 mg | ORAL_TABLET | Freq: Three times a day (TID) | ORAL | Status: DC | PRN

## 2015-03-16 MED ORDER — POLYETHYLENE GLYCOL 3350 17 GM/SCOOP PO POWD: 17.0000 g | Freq: Two times a day (BID) | ORAL | Status: DC

## 2015-03-16 NOTE — ED Provider Notes (Signed)
CSN: [id number]     Arrival date & time 03/16/15  2017 History   First MD Initiated Contact with Patient 03/16/15 2041     Chief Complaint  Patient presents with  . Abdominal Pain     (Consider location/radiation/quality/duration/timing/severity/associated sxs/prior Treatment) HPI Comments: Patient presents to the emergency department with chief complaint of moderate right sided abdominal pain/back pain. She states that the symptoms are moderate in severity. She states the symptoms started 3 days ago. She denies any fever, chills, nausea, vomiting, diarrhea, dysuria, hematuria, vaginal discharge, or vaginal bleeding. There are no aggravating or alleviating factors. Patient denies any history of kidney stones. Denies any prior abdominal surgeries.  The history is provided by the patient. No language interpreter was used.    Past Medical History  Diagnosis Date  . PCOS (polycystic ovarian syndrome)    Past Surgical History  Procedure Laterality Date  . No past surgeries     Family History  Problem Relation Age of Onset  . Throat cancer Maternal Grandmother    Social History  Substance Use Topics  . Smoking status: Never Smoker   . Smokeless tobacco: None  . Alcohol Use: No   OB History    No data available     Review of Systems  Constitutional: Negative for fever and chills.  Respiratory: Negative for shortness of breath.   Cardiovascular: Negative for chest pain.  Gastrointestinal: Positive for abdominal pain. Negative for nausea, vomiting, diarrhea and constipation.  Genitourinary: Negative for dysuria.  All other systems reviewed and are negative.     Allergies  Review of patient's allergies indicates no known allergies.  Home Medications   Prior to Admission medications   Medication Sig Start Date End Date Taking? Authorizing Provider  cyclobenzaprine (FLEXERIL) 10 MG tablet Take 1 tablet (10 mg total) by mouth 2 (two) times daily as needed for muscle spasms.  04/04/14   Elpidio Anis, PA-C  HYDROcodone-acetaminophen (NORCO/VICODIN) 5-325 MG per tablet Take 1-2 tablets by mouth every 4 (four) hours as needed. 04/04/14   Elpidio Anis, PA-C  ibuprofen (ADVIL,MOTRIN) 800 MG tablet Take 1 tablet (800 mg total) by mouth 3 (three) times daily. 04/04/14   Elpidio Anis, PA-C  metFORMIN (GLUCOPHAGE) 500 MG tablet Take 500 mg by mouth 2 (two) times daily with a meal.    Historical Provider, MD   BP 98/69 mmHg  Pulse 65  Temp(Src) 98.6 F (37 C) (Oral)  Resp 20  Ht  (1.778 m)  Wt 178 lb (80.74 kg)  BMI 25.54 kg/m2  SpO2 97%  LMP 02/21/2015 Physical Exam  Constitutional: She is oriented to person, place, and time. She appears well-developed and well-nourished.  HENT:  Head: Normocephalic and atraumatic.  Eyes: Conjunctivae and EOM are normal. Pupils are equal, round, and reactive to light.  Neck: Normal range of motion. Neck supple.  Cardiovascular: Normal rate and regular rhythm.  Exam reveals no gallop and no friction rub.   No murmur heard. Pulmonary/Chest: Effort normal and breath sounds normal. No respiratory distress. She has no wheezes. She has no rales. She exhibits no tenderness.  Abdominal: Soft. Bowel sounds are normal. She exhibits no distension and no mass. There is no tenderness. There is no rebound and no guarding.  No focal abdominal tenderness, no RLQ tenderness or pain at McBurney's point, no RUQ tenderness or Murphy's sign, no left-sided abdominal tenderness, no fluid wave, or signs of peritonitis   Musculoskeletal: Normal range of motion. She exhibits no edema or tenderness.  Neurological: She is alert and oriented to person, place, and time.  Skin: Skin is warm and dry.  Psychiatric: She has a normal mood and affect. Her behavior is normal. Judgment and thought content normal.  Nursing note and vitals reviewed.   ED Course  Procedures (including critical care time) Results for orders placed or performed during the hospital  encounter of 03/16/15  Urinalysis, Routine w reflex microscopic (not at [facility])  Result Value Ref Range   Color, Urine YELLOW YELLOW   APPearance CLOUDY (A) CLEAR   Specific Gravity, Urine 1.024 1.005 - 1.030   pH 8.0 5.0 - 8.0   Glucose, UA NEGATIVE NEGATIVE mg/dL   Hgb urine dipstick NEGATIVE NEGATIVE   Bilirubin Urine NEGATIVE NEGATIVE   Ketones, ur NEGATIVE NEGATIVE mg/dL   Protein, ur NEGATIVE NEGATIVE mg/dL   Urobilinogen, UA 1.0 0.0 - 1.0 mg/dL   Nitrite NEGATIVE NEGATIVE   Leukocytes, UA NEGATIVE NEGATIVE  Pregnancy, urine  Result Value Ref Range   Preg Test, Ur NEGATIVE NEGATIVE  CBC with Differential/Platelet  Result Value Ref Range   WBC 6.4 4.0 - 10.5 K/uL   RBC 4.37 3.87 - 5.11 MIL/uL   Hemoglobin 11.5 (L) 12.0 - 15.0 g/dL   HCT 16.1 (L) 09.6 - 04.5 %   MCV 82.2 78.0 - 100.0 fL   MCH 26.3 26.0 - 34.0 pg   MCHC 32.0 30.0 - 36.0 g/dL   RDW 40.9 81.1 - 91.4 %   Platelets 261 150 - 400 K/uL   Neutrophils Relative % 40 (L) 43 - 77 %   Neutro Abs 2.5 1.7 - 7.7 K/uL   Lymphocytes Relative 48 (H) 12 - 46 %   Lymphs Abs 3.1 0.7 - 4.0 K/uL   Monocytes Relative 8 3 - 12 %   Monocytes Absolute 0.5 0.1 - 1.0 K/uL   Eosinophils Relative 3 0 - 5 %   Eosinophils Absolute 0.2 0.0 - 0.7 K/uL   Basophils Relative 1 0 - 1 %   Basophils Absolute 0.0 0.0 - 0.1 K/uL  Comprehensive metabolic panel  Result Value Ref Range   Sodium 139 135 - 145 mmol/L   Potassium 3.9 3.5 - 5.1 mmol/L   Chloride 107 101 - 111 mmol/L   CO2 26 22 - 32 mmol/L   Glucose, Bld 102 (H) 65 - 99 mg/dL   BUN 8 6 - 20 mg/dL   Creatinine, Ser 7.82 0.44 - 1.00 mg/dL   Calcium 8.6 (L) 8.9 - 10.3 mg/dL   Total Protein 6.4 (L) 6.5 - 8.1 g/dL   Albumin 3.6 3.5 - 5.0 g/dL   AST 13 (L) 15 - 41 U/L   ALT 11 (L) 14 - 54 U/L   Alkaline Phosphatase 48 38 - 126 U/L   Total Bilirubin 0.2 (L) 0.3 - 1.2 mg/dL   GFR calc non Af Amer >60 >60 mL/min   GFR calc Af Amer >60 >60 mL/min   Anion gap 6 5 - 15  Lipase, blood   Result Value Ref Range   Lipase 32 22 - 51 U/L   No results found.    MDM   Final diagnoses:  Abdominal pain, unspecified abdominal location    Patient with abdominal pain and back pain. Urinalysis is negative for blood. Pregnancy test negative. Doubt kidney stone. Patient is not present with physical appearance consistent with kidney stones. She is actually very well-appearing. She does not have any focal abdominal tenderness on exam. Her vital signs are all stable.  I will check basic labs, but suspect the patient may follow-up with her primary care provider, or return if symptoms worsen. Doubt any surgical emergency or other acute condition requiring admission or emergent intervention.  Labs are reassuring. Patient reassessed, abdomen is soft and nontender, will recommend close follow-up. Return precautions given. Patient understands and agrees to plan. She is stable and her for discharge.  I personally performed the services described in this documentation, which was scribed in my presence. The recorded information has been reviewed and is accurate.     Roxy Horseman, PA-C 03/16/15 2259  Gwyneth Sprout, MD 03/17/15 [phone]

## 2015-03-16 NOTE — ED Notes (Signed)
"  feel a little better", to xray via stretcher

## 2015-03-16 NOTE — ED Notes (Signed)
C/o abd pain, also back pain, R>L, 8/10, onset 3d ago, last BM yesterday (normal), last ate 1500, (denies: other sx; denies: nvd, fever, bleeding, dizziness, urinary or vaginal sx), h/o PCOS. VSS. Pt alert, NAD< calm, interactive, resps e/u, speaking in clear complete sentences, no dyspnea noted.

## 2015-03-16 NOTE — ED Notes (Signed)
Abdominal pain x 3 days. Pain goes into her right flank.

## 2015-03-16 NOTE — ED Notes (Signed)
Alert, NAD, calm, no changes.  

## 2015-03-16 NOTE — Discharge Instructions (Signed)

## 2017-07-18 ENCOUNTER — Encounter (HOSPITAL_BASED_OUTPATIENT_CLINIC_OR_DEPARTMENT_OTHER): Payer: Self-pay | Admitting: *Deleted

## 2017-07-18 ENCOUNTER — Emergency Department (HOSPITAL_BASED_OUTPATIENT_CLINIC_OR_DEPARTMENT_OTHER): Payer: PRIVATE HEALTH INSURANCE

## 2017-07-18 ENCOUNTER — Emergency Department (HOSPITAL_BASED_OUTPATIENT_CLINIC_OR_DEPARTMENT_OTHER)
Admission: EM | Admit: 2017-07-18 | Discharge: 2017-07-18 | Disposition: A | Discharge status: Home / Self Care | Payer: PRIVATE HEALTH INSURANCE | Attending: Emergency Medicine | Admitting: Emergency Medicine

## 2017-07-18 ENCOUNTER — Other Ambulatory Visit: Payer: Self-pay

## 2017-07-18 DIAGNOSIS — B349 Viral infection, unspecified: Secondary | ICD-10-CM | POA: Insufficient documentation

## 2017-07-18 DIAGNOSIS — Z79899 Other long term (current) drug therapy: Secondary | ICD-10-CM | POA: Diagnosis not present

## 2017-07-18 DIAGNOSIS — M791 Myalgia, unspecified site: Secondary | ICD-10-CM | POA: Diagnosis present

## 2017-07-18 DIAGNOSIS — Z7984 Long term (current) use of oral hypoglycemic drugs: Secondary | ICD-10-CM | POA: Insufficient documentation

## 2017-07-18 DIAGNOSIS — R509 Fever, unspecified: Secondary | ICD-10-CM

## 2017-07-18 LAB — URINALYSIS, MICROSCOPIC (REFLEX): RBC / HPF: NONE SEEN RBC/hpf (ref 0–5)

## 2017-07-18 LAB — URINALYSIS, ROUTINE W REFLEX MICROSCOPIC
BILIRUBIN URINE: NEGATIVE
Glucose, UA: NEGATIVE mg/dL
Hgb urine dipstick: NEGATIVE
Ketones, ur: NEGATIVE mg/dL
NITRITE: NEGATIVE
PROTEIN: NEGATIVE mg/dL
SPECIFIC GRAVITY, URINE: 1.02 (ref 1.005–1.030)
pH: 6 (ref 5.0–8.0)

## 2017-07-18 LAB — RAPID STREP SCREEN (MED CTR MEBANE ONLY): Streptococcus, Group A Screen (Direct): NEGATIVE

## 2017-07-18 LAB — PREGNANCY, URINE: PREG TEST UR: NEGATIVE

## 2017-07-18 MED ORDER — IBUPROFEN 400 MG PO TABS
400.0000 mg | ORAL_TABLET | Freq: Once | ORAL | Status: AC
  Administered 2017-07-18: 400 mg via ORAL
  Filled 2017-07-18: qty 1

## 2017-07-18 NOTE — ED Provider Notes (Signed)
[facility] EMERGENCY DEPARTMENT Provider Note   CSN: [id number] Arrival date & time: 07/18/17  1742     History   Chief Complaint Chief Complaint  Patient presents with  . Generalized Body Aches    HPI Julie Reyes is a 32 y.o. female. Chief complaint is fever and body aches.  HPI 32 year old female. Otherwise healthy. Fever with body aches for the last 2 days. Occasional cough. Mild sore throat. No neck pain. No GI complaints.  She was immunized for influenza. No dysuria for used to hematuria. Occasional dry cough. Does not feel short of breath. No nasal discharge.  Past Medical History:  Diagnosis Date  . PCOS (polycystic ovarian syndrome)     Patient Active Problem List   Diagnosis Date Noted  . OSA (obstructive sleep apnea) 12/30/2013    Past Surgical History:  Procedure Laterality Date  . NO PAST SURGERIES      OB History    No data available       Home Medications    Prior to Admission medications   Medication Sig Start Date End Date Taking? Authorizing Provider  cyclobenzaprine (FLEXERIL) 10 MG tablet Take 1 tablet (10 mg total) by mouth 2 (two) times daily as needed for muscle spasms. 04/04/14   Elpidio AnisUpstill, Shari, PA-C  HYDROcodone-acetaminophen (NORCO/VICODIN) 5-325 MG per tablet Take 1-2 tablets by mouth every 4 (four) hours as needed. 04/04/14   Elpidio AnisUpstill, Shari, PA-C  ibuprofen (ADVIL,MOTRIN) 800 MG tablet Take 1 tablet (800 mg total) by mouth 3 (three) times daily. 04/04/14   Elpidio AnisUpstill, Shari, PA-C  metFORMIN (GLUCOPHAGE) 500 MG tablet Take 500 mg by mouth 2 (two) times daily with a meal.    [provider]  ondansetron (ZOFRAN ODT) 4 MG disintegrating tablet Take 1 tablet (4 mg total) by mouth every 8 (eight) hours as needed for nausea or vomiting. 03/16/15   Roxy HorsemanBrowning, Robert, PA-C  polyethylene glycol powder (GLYCOLAX/MIRALAX) powder Take 17 g by mouth 2 (two) times daily. Until daily soft stools  OTC 03/16/15   Roxy HorsemanBrowning, Robert, PA-C      Family History Family History  Problem Relation Age of Onset  . Throat cancer Maternal Grandmother     Social History Social History   Tobacco Use  . Smoking status: Never Smoker  . Smokeless tobacco: Never Used  Substance Use Topics  . Alcohol use: No  . Drug use: No     Allergies   Patient has no known allergies.   Review of Systems Review of Systems  Constitutional: Positive for fatigue and fever. Negative for appetite change, chills and diaphoresis.  HENT: Positive for sore throat. Negative for mouth sores and trouble swallowing.   Eyes: Negative for visual disturbance.  Respiratory: Negative for cough, chest tightness, shortness of breath and wheezing.   Cardiovascular: Negative for chest pain.  Gastrointestinal: Negative for abdominal distention, abdominal pain, diarrhea, nausea and vomiting.  Endocrine: Negative for polydipsia, polyphagia and polyuria.  Genitourinary: Negative for dysuria, frequency and hematuria.  Musculoskeletal: Negative for gait problem.  Skin: Negative for color change, pallor and rash.  Neurological: Negative for dizziness, syncope, light-headedness and headaches.  Hematological: Does not bruise/bleed easily.  Psychiatric/Behavioral: Negative for behavioral problems and confusion.     Physical Exam Updated Vital Signs BP 109/74 (BP Location: Right Arm)   Pulse 84   Temp (!) 100.6 F (38.1 C) (Oral)   Resp 20   Ht 5\' 1"  (1.549 m)   Wt 80.7 kg (178 lb)  LMP 06/14/2017   SpO2 100%   BMI 33.63 kg/m   Physical Exam  Constitutional: She is oriented to person, place, and time. She appears well-developed and well-nourished. No distress.  HENT:  Head: Normocephalic.  Eyes: Conjunctivae are normal. Pupils are equal, round, and reactive to light. No scleral icterus.  Neck: Normal range of motion. Neck supple. No thyromegaly present.  Cardiovascular: Normal rate and regular rhythm. Exam reveals no gallop and no friction rub.  No  murmur heard. Pulmonary/Chest: Effort normal and breath sounds normal. No respiratory distress. She has no wheezes. She has no rales.  Abdominal: Soft. Bowel sounds are normal. She exhibits no distension. There is no tenderness. There is no rebound.  Musculoskeletal: Normal range of motion.  Neurological: She is alert and oriented to person, place, and time.  Skin: Skin is warm and dry. No rash noted.  Psychiatric: She has a normal mood and affect. Her behavior is normal.     ED Treatments / Results  Labs (all labs ordered are listed, but only abnormal results are displayed) Labs Reviewed  URINALYSIS, ROUTINE W REFLEX MICROSCOPIC - Abnormal; Notable for the following components:      Result Value   APPearance CLOUDY (*)    Leukocytes, UA MODERATE (*)    All other components within normal limits  URINALYSIS, MICROSCOPIC (REFLEX) - Abnormal; Notable for the following components:   Bacteria, UA FEW (*)    Squamous Epithelial / LPF 6-30 (*)    All other components within normal limits  RAPID STREP SCREEN (NOT AT [facility])  URINE CULTURE  CULTURE, GROUP A STREP [facility])  PREGNANCY, URINE    EKG  EKG Interpretation None       Radiology Dg Chest 2 View  Result Date: 07/18/2017 CLINICAL DATA:  Fever, body aches, headache a few days. EXAM: CHEST  2 VIEW COMPARISON:  None. FINDINGS: Lungs are adequately inflated without consolidation or effusion. Cardiomediastinal silhouette is within normal. Bones soft tissues are normal. IMPRESSION: No active cardiopulmonary disease. Electronically Signed   By: Elberta Fortis M.D.   On: 07/18/2017 19:56    Procedures Procedures (including critical care time)  Medications Ordered in ED Medications  ibuprofen (ADVIL,MOTRIN) tablet 400 mg (400 mg Oral Given 07/18/17 1932)     Initial Impression / Assessment and Plan / ED Course  I have reviewed the triage vital signs and the nursing notes.  Pertinent labs & imaging results that were available  during my care of the patient were reviewed by me and considered in my medical decision making (see chart for details).   urine obtained at triage shows 6-30 white blood cells but 6-30 squamous cells few bacteria and nitrate negative. She is not symptomatic. We'll culture but not treat. Strep swab negative. Chest x-ray shows no infiltrates or other acute findings. Given Motrin. Temperature improves. Initially 11.9. Not hypoxemic or tachycardic. Supple neck. No signs or findings to suggest suppurative bacterial infection or need for specific treatment. Plan expectant management. Anti-inflammatories antipyretics fluids. Work note.    Final Clinical Impressions(s) / ED Diagnoses   Final diagnoses:  Viral syndrome  Fever, unspecified fever cause    ED Discharge Orders    None       Rolland Porter, MD 07/18/17 2017

## 2017-07-18 NOTE — ED Notes (Signed)
Pt discharged to home with family. NAD.  

## 2017-07-18 NOTE — ED Triage Notes (Signed)
Body aches, headache and chills x 2 days.

## 2017-07-18 NOTE — Discharge Instructions (Signed)
Rest, push fluids, Motrin or Tylenol for fever.

## 2017-07-20 LAB — URINE CULTURE

## 2017-07-21 LAB — CULTURE, GROUP A STREP (THRC)

## 2017-12-27 ENCOUNTER — Emergency Department (HOSPITAL_BASED_OUTPATIENT_CLINIC_OR_DEPARTMENT_OTHER)
Admission: EM | Admit: 2017-12-27 | Discharge: 2017-12-27 | Disposition: A | Discharge status: Home / Self Care | Payer: PRIVATE HEALTH INSURANCE | Attending: Emergency Medicine | Admitting: Emergency Medicine

## 2017-12-27 ENCOUNTER — Other Ambulatory Visit: Payer: Self-pay

## 2017-12-27 ENCOUNTER — Encounter (HOSPITAL_BASED_OUTPATIENT_CLINIC_OR_DEPARTMENT_OTHER): Payer: Self-pay

## 2017-12-27 DIAGNOSIS — R1030 Lower abdominal pain, unspecified: Secondary | ICD-10-CM | POA: Diagnosis present

## 2017-12-27 LAB — URINALYSIS, MICROSCOPIC (REFLEX)

## 2017-12-27 LAB — CBC WITH DIFFERENTIAL/PLATELET
BASOS ABS: 0 10*3/uL (ref 0.0–0.1)
BASOS PCT: 0 %
EOS ABS: 0.1 10*3/uL (ref 0.0–0.7)
Eosinophils Relative: 1 %
HCT: 36 % (ref 36.0–46.0)
HEMOGLOBIN: 12.2 g/dL (ref 12.0–15.0)
Lymphocytes Relative: 26 %
Lymphs Abs: 2.2 10*3/uL (ref 0.7–4.0)
MCH: 27.5 pg (ref 26.0–34.0)
MCHC: 33.9 g/dL (ref 30.0–36.0)
MCV: 81.1 fL (ref 78.0–100.0)
MONOS PCT: 9 %
Monocytes Absolute: 0.8 10*3/uL (ref 0.1–1.0)
NEUTROS PCT: 64 %
Neutro Abs: 5.4 10*3/uL (ref 1.7–7.7)
Platelets: 272 10*3/uL (ref 150–400)
RBC: 4.44 MIL/uL (ref 3.87–5.11)
RDW: 14.4 % (ref 11.5–15.5)
WBC: 8.5 10*3/uL (ref 4.0–10.5)

## 2017-12-27 LAB — URINALYSIS, ROUTINE W REFLEX MICROSCOPIC
Bilirubin Urine: NEGATIVE
GLUCOSE, UA: NEGATIVE mg/dL
Ketones, ur: NEGATIVE mg/dL
LEUKOCYTES UA: NEGATIVE
NITRITE: NEGATIVE
PROTEIN: NEGATIVE mg/dL
SPECIFIC GRAVITY, URINE: 1.025 (ref 1.005–1.030)
pH: 6 (ref 5.0–8.0)

## 2017-12-27 LAB — COMPREHENSIVE METABOLIC PANEL
ALBUMIN: 3.8 g/dL (ref 3.5–5.0)
ALK PHOS: 42 U/L (ref 38–126)
ALT: 18 U/L (ref 0–44)
ANION GAP: 6 (ref 5–15)
AST: 15 U/L (ref 15–41)
BUN: 11 mg/dL (ref 6–20)
CALCIUM: 8.5 mg/dL — AB (ref 8.9–10.3)
CO2: 27 mmol/L (ref 22–32)
Chloride: 104 mmol/L (ref 98–111)
Creatinine, Ser: 0.81 mg/dL (ref 0.44–1.00)
GFR calc Af Amer: >60 mL/min (ref >60)
GFR calc non Af Amer: >60 mL/min (ref >60)
GLUCOSE: 90 mg/dL (ref 70–99)
POTASSIUM: 3.9 mmol/L (ref 3.5–5.1)
SODIUM: 137 mmol/L (ref 135–145)
Total Bilirubin: 0.5 mg/dL (ref 0.3–1.2)
Total Protein: 6.9 g/dL (ref 6.5–8.1)

## 2017-12-27 LAB — LIPASE, BLOOD: Lipase: 111 U/L — ABNORMAL HIGH (ref 11–51)

## 2017-12-27 LAB — PREGNANCY, URINE: PREG TEST UR: NEGATIVE

## 2017-12-27 NOTE — ED Provider Notes (Signed)
[facility] EMERGENCY DEPARTMENT Provider Note  CSN: [id number] Arrival date & time: 12/27/17  1126  History   Chief Complaint Chief Complaint  Patient presents with  . Abdominal Pain    HPI Julie Reyes is a 32 y.o. female with a medical history of PCOS and OSA who presented to the ED for abdominal pain x4 days. Patient describes cramping in her lower abdomen that began 2 days after her menstrual period. She states the pain is intermittent and cannot identify any triggers, positions or foods. Pain is relieved with ibuprofen. Patient believes that her period ended on Monday, but has had some light vaginal spotting since then. Denies fever, abdominal pain, N/V/D/C or changes in urinary habits. Denies vaginal/pelvic pain or vaginal discharge. Denies alcohol, substance or tobacco use.  Past Medical History:  Diagnosis Date  . PCOS (polycystic ovarian syndrome)     Patient Active Problem List   Diagnosis Date Noted  . OSA (obstructive sleep apnea) 12/30/2013    Past Surgical History:  Procedure Laterality Date  . NO PAST SURGERIES       OB History   None      Home Medications    Prior to Admission medications   Not on File    Family History Family History  Problem Relation Age of Onset  . Throat cancer Maternal Grandmother     Social History Social History   Tobacco Use  . Smoking status: Never Smoker  . Smokeless tobacco: Never Used  Substance Use Topics  . Alcohol use: No  . Drug use: No     Allergies   Patient has no known allergies.   Review of Systems Review of Systems  Constitutional: Negative for activity change, appetite change, chills and fever.  Gastrointestinal: Positive for abdominal pain. Negative for blood in stool, constipation, diarrhea, nausea, rectal pain and vomiting.  Genitourinary: Positive for vaginal bleeding. Negative for difficulty urinating, dysuria, frequency, vaginal discharge and vaginal pain.  Skin: Negative.    Neurological: Negative.      Physical Exam Updated Vital Signs BP (!) 114/95 (BP Location: Right Arm)   Pulse 79   Temp 98.3 F (36.8 C) (Oral)   Resp 16   Ht 5\' 1"  (1.549 m)   Wt 74.8 kg (165 lb)   LMP 12/20/2017   SpO2 100%   BMI 31.18 kg/m   Physical Exam  Constitutional: She appears well-developed and well-nourished. She does not appear ill. No distress.  Eyes: Pupils are equal, round, and reactive to light. EOM are normal.  Cardiovascular: Normal rate, regular rhythm, normal heart sounds and intact distal pulses.  Pulmonary/Chest: Effort normal and breath sounds normal.  Abdominal: Soft. Normal appearance and bowel sounds are normal. There is no hepatosplenomegaly. There is no tenderness.  Skin: Skin is warm.  Nursing note and vitals reviewed.    ED Treatments / Results  Labs (all labs ordered are listed, but only abnormal results are displayed) Labs Reviewed  COMPREHENSIVE METABOLIC PANEL - Abnormal; Notable for the following components:      Result Value   Calcium 8.5 (*)    All other components within normal limits  LIPASE, BLOOD - Abnormal; Notable for the following components:   Lipase 111 (*)    All other components within normal limits  URINALYSIS, ROUTINE W REFLEX MICROSCOPIC - Abnormal; Notable for the following components:   Hgb urine dipstick TRACE (*)    All other components within normal limits  URINALYSIS, MICROSCOPIC (REFLEX) - Abnormal; Notable  for the following components:   Bacteria, UA RARE (*)    All other components within normal limits  PREGNANCY, URINE  CBC WITH DIFFERENTIAL/PLATELET    EKG None  Radiology No results found.  Procedures Procedures (including critical care time)  Medications Ordered in ED Medications - No data to display   Initial Impression / Assessment and Plan / ED Course  Triage vital signs and the nursing notes have been reviewed.  Pertinent labs & imaging results that were available during care of the  patient were reviewed and considered in medical decision making (see chart for details).  Clinical Course as of Dec 27 1620  Wed Dec 27, 2017  1457 Lipase mildly elevated, but in the context of remaining labs being normal. Pancreatitis is effectively ruled out.   [GM]    Clinical Course User Index [GM] Mortis, Sharyon [employer], [place]   Patient presents in no acute distress and is asymptomatic. Labs and physical exam are reassuring that there is not an acute intra-abdominal process that requires imaging today. Given history, intermittent nature of pain and recent beginning of menstrual period, it is likely that abdominal pain is due to menstrual cramps. Patient has no risk factors or s/s of an acute gyn or urinary process. UA is not indicative of UTI.   Final Clinical Impressions(s) / ED Diagnoses  1. Abdominal Pain. Likely due to menstrual cramps. Education provided. Advised to follow-up with gyn or PCP.  Dispo: Home. After thorough clinical evaluation, this patient is determined to be medically stable and can be safely discharged with the previously mentioned treatment and/or outpatient follow-up/referral(s). At this time, there are no other apparent medical conditions that require further screening, evaluation or treatment.   Final diagnoses:  Lower abdominal pain    ED Discharge Orders    None        Reva Bores 12/27/17 1622    Vanetta Mulders, MD 12/28/17 [phone]

## 2017-12-27 NOTE — Discharge Instructions (Addendum)
You may continue using ibuprofen for pain relief. Hot compresses may also help as well. Follow-up with your PCP to discuss any changes in your periods that may be concerning.

## 2017-12-27 NOTE — ED Triage Notes (Signed)
C/o abd cramping and vaginal spotting-states LMP started 6/19-NAD-steady gait

## 2017-12-27 NOTE — ED Notes (Signed)
Pt verbalizes understanding of d/c instructions and denies any further needs at this time. 

## 2020-01-06 IMAGING — CR DG CHEST 2V
2 series · 2 of 2 positions shown · non-contrast
Comparison: None.

CLINICAL DATA: Fever, body aches, headache a few days.

EXAM:
CHEST  2 VIEW

[w chest pa]
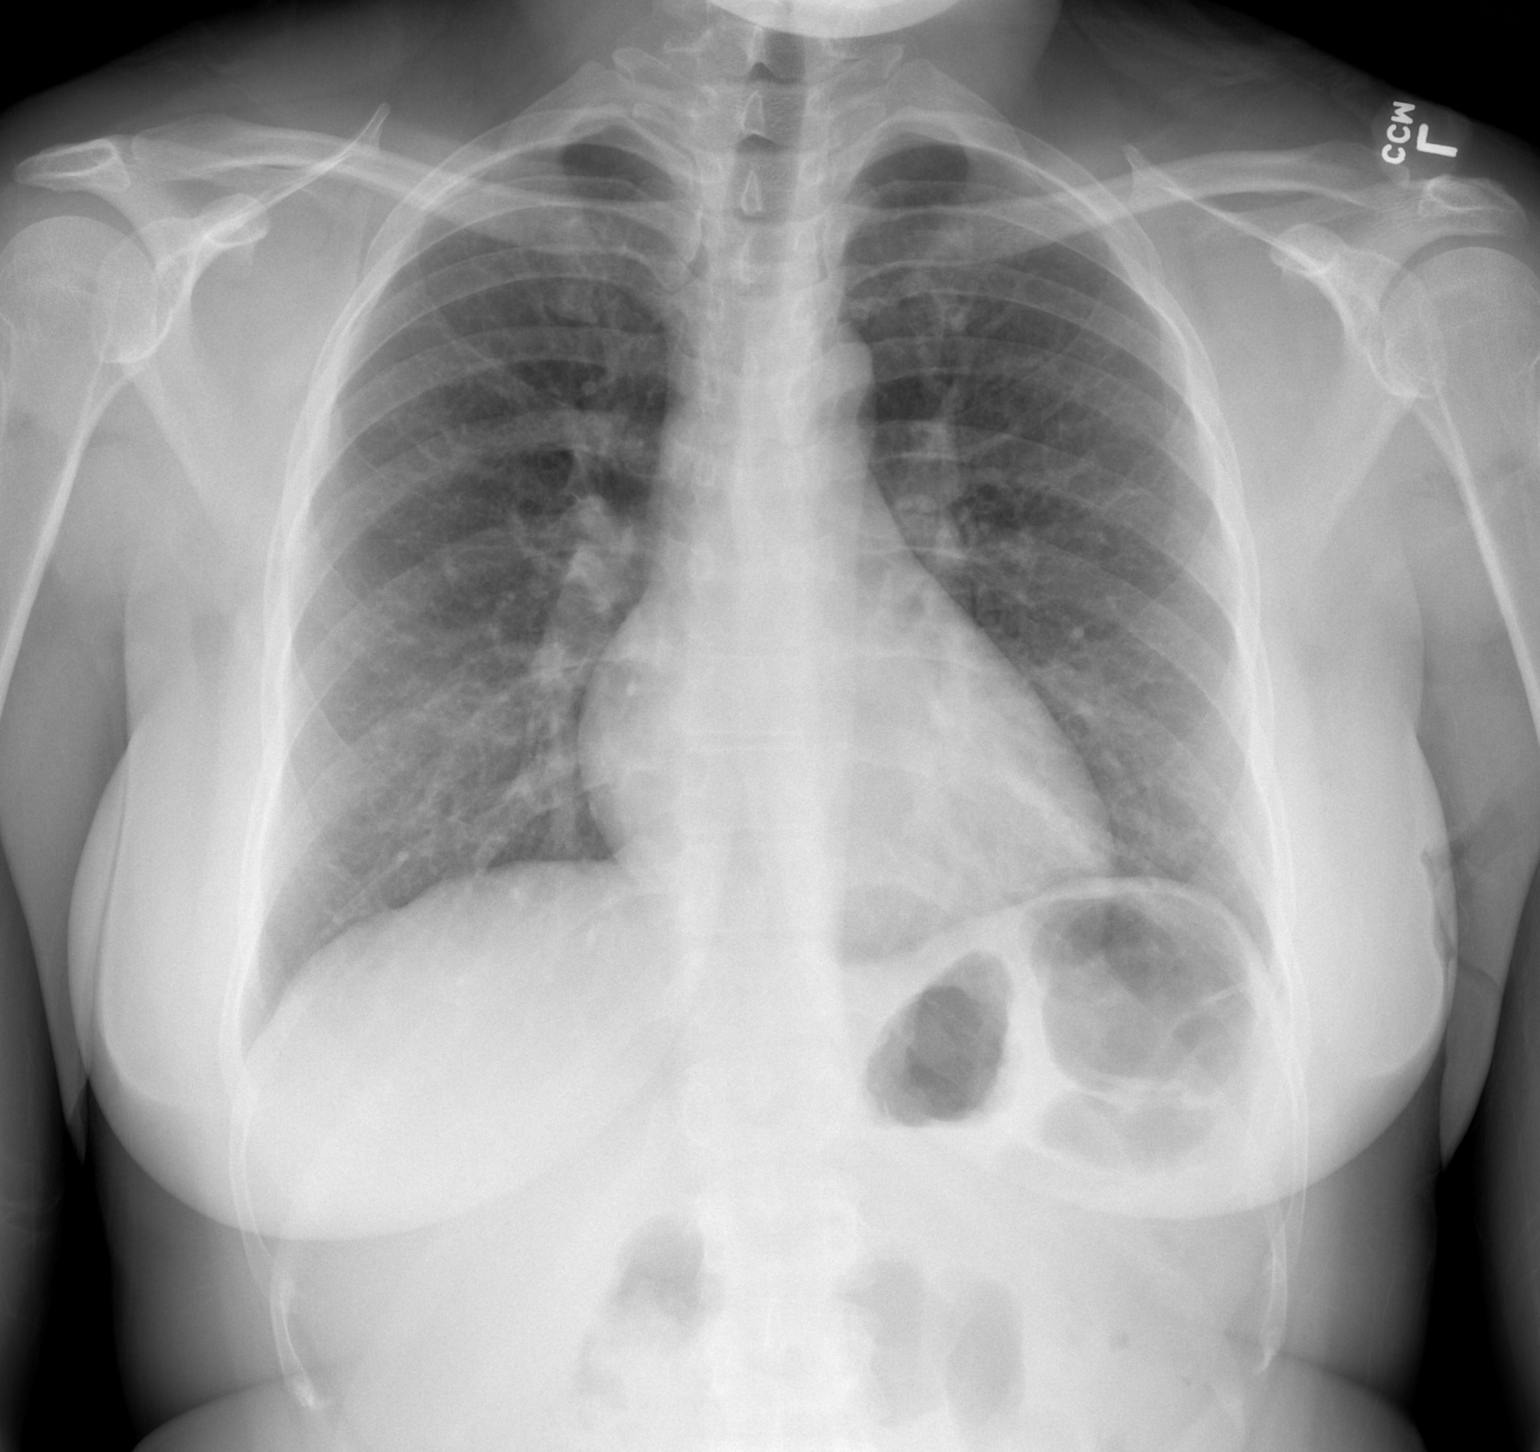

[w chest lat]
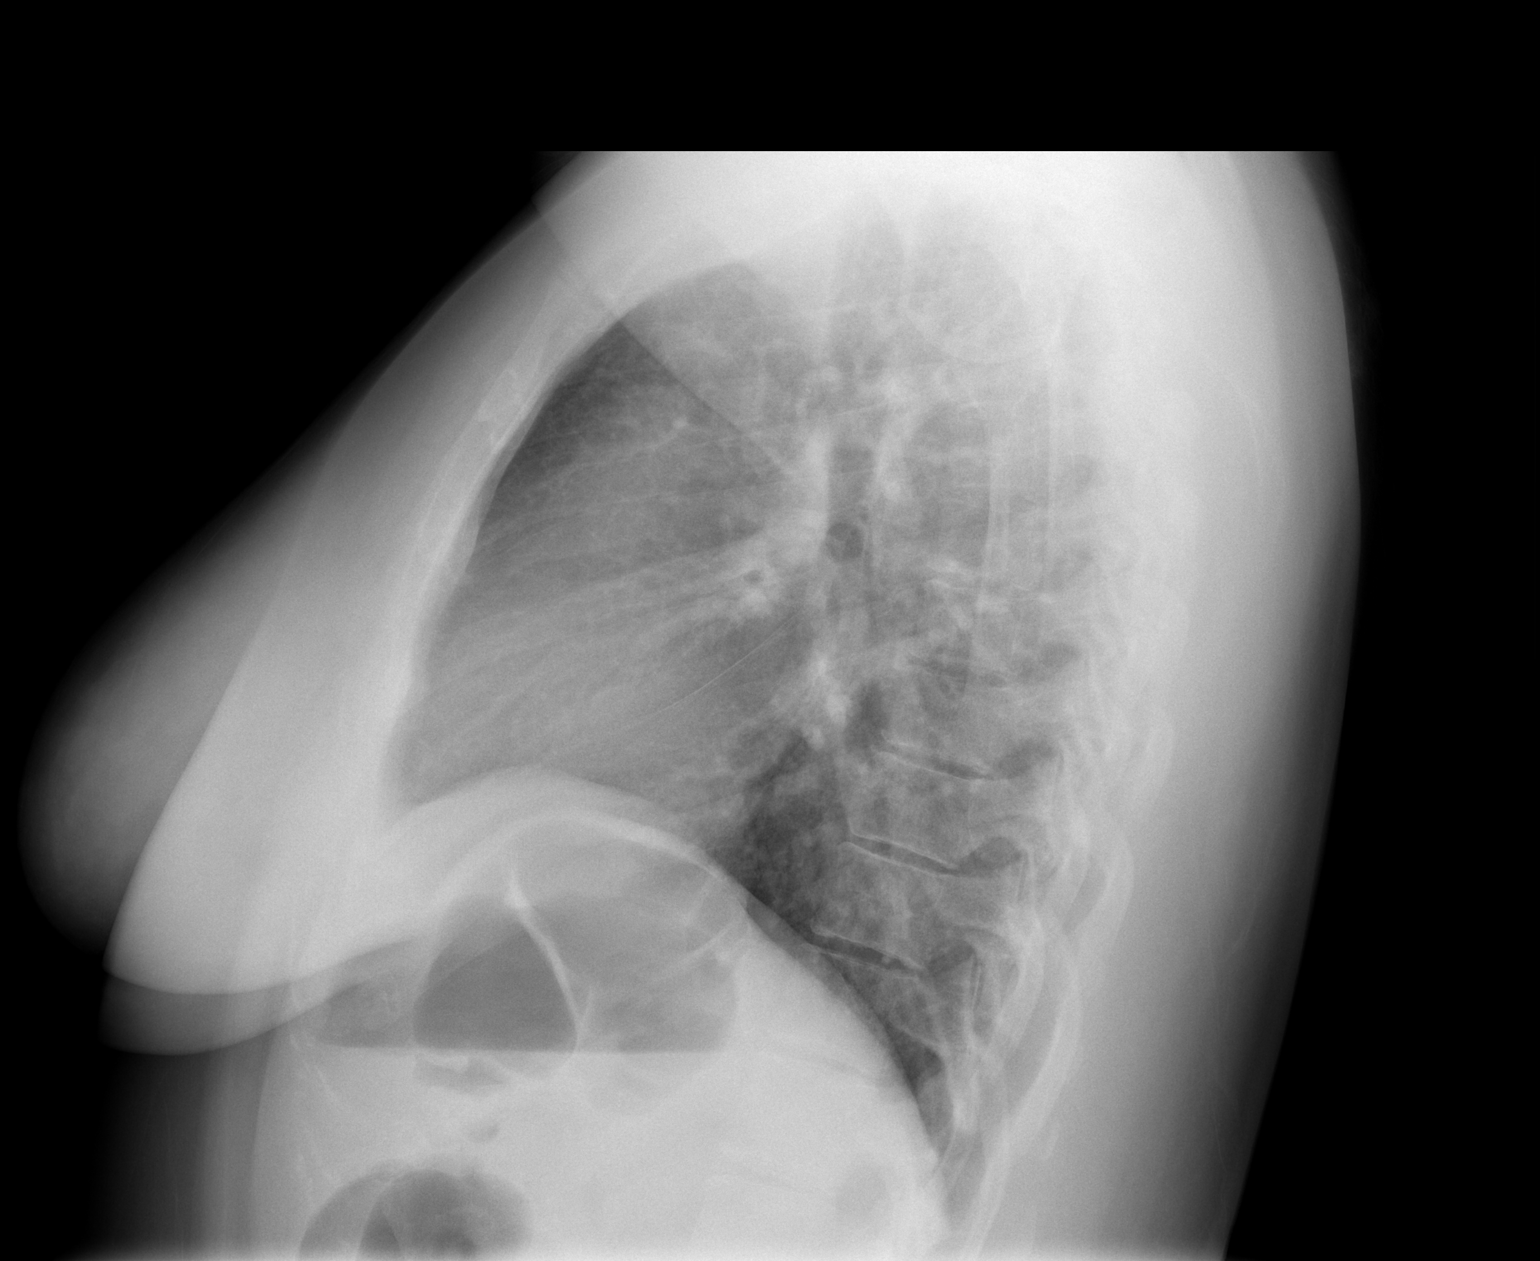

[2 of 2 positions shown; findings below may reference images not displayed]

FINDINGS: Lungs are adequately inflated without consolidation or effusion.
Cardiomediastinal silhouette is within normal. Bones soft tissues
are normal.
IMPRESSION: No active cardiopulmonary disease.
